# Patient Record
Sex: Female | Born: 1995 | Race: White | Hispanic: No | Marital: Single | Smoking: Never smoker
Health system: Southern US, Community
[De-identification: ages and names within clinical notes are randomized; demographics above are authoritative.]

---

## 2017-04-14 ENCOUNTER — Encounter (HOSPITAL_COMMUNITY): Payer: Self-pay | Admitting: Emergency Medicine

## 2017-04-14 ENCOUNTER — Other Ambulatory Visit: Payer: Self-pay

## 2017-04-14 ENCOUNTER — Inpatient Hospital Stay (HOSPITAL_COMMUNITY)
Admission: EM | Admit: 2017-04-14 | Discharge: 2017-04-16 | DRG: 343 | Disposition: A | Discharge status: Home / Self Care | Payer: BLUE CROSS/BLUE SHIELD | Attending: Surgery | Admitting: Surgery

## 2017-04-14 DIAGNOSIS — K37 Unspecified appendicitis: Secondary | ICD-10-CM | POA: Diagnosis present

## 2017-04-14 DIAGNOSIS — K3589 Other acute appendicitis without perforation or gangrene: Secondary | ICD-10-CM | POA: Diagnosis not present

## 2017-04-14 DIAGNOSIS — R000 Tachycardia, unspecified: Secondary | ICD-10-CM | POA: Diagnosis not present

## 2017-04-14 DIAGNOSIS — K3580 Unspecified acute appendicitis: Secondary | ICD-10-CM | POA: Diagnosis present

## 2017-04-14 LAB — URINALYSIS, ROUTINE W REFLEX MICROSCOPIC
BILIRUBIN URINE: NEGATIVE
GLUCOSE, UA: NEGATIVE mg/dL
HGB URINE DIPSTICK: NEGATIVE
KETONES UR: NEGATIVE mg/dL
Leukocytes, UA: NEGATIVE
Nitrite: NEGATIVE
PH: 5 (ref 5.0–8.0)
Protein, ur: NEGATIVE mg/dL
SPECIFIC GRAVITY, URINE: 1.009 (ref 1.005–1.030)

## 2017-04-14 LAB — COMPREHENSIVE METABOLIC PANEL
ALBUMIN: 3.8 g/dL (ref 3.5–5.0)
ALT: 20 U/L (ref 14–54)
ANION GAP: 7 (ref 5–15)
AST: 21 U/L (ref 15–41)
Alkaline Phosphatase: 51 U/L (ref 38–126)
BILIRUBIN TOTAL: 0.5 mg/dL (ref 0.3–1.2)
BUN: 9 mg/dL (ref 6–20)
CO2: 23 mmol/L (ref 22–32)
Calcium: 9.2 mg/dL (ref 8.9–10.3)
Chloride: 107 mmol/L (ref 101–111)
Creatinine, Ser: 0.85 mg/dL (ref 0.44–1.00)
GFR calc Af Amer: >60 mL/min (ref >60)
GFR calc non Af Amer: >60 mL/min (ref >60)
GLUCOSE: 106 mg/dL — AB (ref 65–99)
POTASSIUM: 3.8 mmol/L (ref 3.5–5.1)
SODIUM: 137 mmol/L (ref 135–145)
Total Protein: 7 g/dL (ref 6.5–8.1)

## 2017-04-14 LAB — I-STAT BETA HCG BLOOD, ED (MC, WL, AP ONLY): I-stat hCG, quantitative: <5 m[IU]/mL (ref <5)

## 2017-04-14 LAB — CBC
HEMATOCRIT: 42.7 % (ref 36.0–46.0)
HEMOGLOBIN: 14 g/dL (ref 12.0–15.0)
MCH: 28.1 pg (ref 26.0–34.0)
MCHC: 32.8 g/dL (ref 30.0–36.0)
MCV: 85.6 fL (ref 78.0–100.0)
Platelets: 308 10*3/uL (ref 150–400)
RBC: 4.99 MIL/uL (ref 3.87–5.11)
RDW: 13.3 % (ref 11.5–15.5)
WBC: 5.4 10*3/uL (ref 4.0–10.5)

## 2017-04-14 LAB — LIPASE, BLOOD: Lipase: 34 U/L (ref 11–51)

## 2017-04-14 MED ORDER — IOPAMIDOL (ISOVUE-300) INJECTION 61%
INTRAVENOUS | Status: AC
  Administered 2017-04-15: 100 mL
  Filled 2017-04-14: qty 100

## 2017-04-14 NOTE — ED Triage Notes (Signed)
Pt. Stated, I've had stomach pain on the right for 2 days had it earlier in the month and it went away and went away

## 2017-04-14 NOTE — ED Provider Notes (Signed)
[facility] Provider Note   CSN: [id number] Arrival date & time: 04/14/17  1220     History   Chief Complaint Chief Complaint  Patient presents with  . Abdominal Pain    right    HPI Katheren ShamsJenna S Radler is a 21 y.o. female.  The history is provided by the patient and medical records.  Abdominal Pain   Associated symptoms include nausea.    21 y.o. F with no significant medical history presenting to the ED with abdominal pain.  Patient reports she has had about 3 days of lower abdominal pain.  States it ranges from her navel to her right lower abdomen.  Pain is dull, constant, and aching.  States she has had a lot of nausea and has not been able to eat very much.  She has not had any vomiting or diarrhea.  No fever or chills.  No urinary symptoms.  No pelvic pain, changes in menstrual cycle, or vaginal discharge.  States she did have pain like this earlier in the month but not as severe, this went away on its own.  She has not noticed anything that alleviates or exacerbates her pain.  No prior abdominal surgeries.  History reviewed. No pertinent past medical history.  There are no active problems to display for this patient.   History reviewed. No pertinent surgical history.  OB History    No data available       Home Medications    Prior to Admission medications   Medication Sig Start Date End Date Taking? Authorizing Provider  NIKKI 3-0.02 MG tablet Take 1 tablet by mouth daily. 02/07/17  Yes [provider]    Family History No family history on file.  Social History Social History   Tobacco Use  . Smoking status: Never Smoker  . Smokeless tobacco: Never Used  Substance Use Topics  . Alcohol use: Yes  . Drug use: No     Allergies   Latex   Review of Systems Review of Systems  Gastrointestinal: Positive for abdominal pain and nausea.  All other systems reviewed and are negative.    Physical  Exam Updated Vital Signs BP 115/75   Pulse 93   Temp 98.4 F (36.9 C) (Oral)   Resp 18   Ht 5\' 10"  (1.778 m)   Wt 77.1 kg (170 lb)   LMP 03/31/2017   SpO2 99%   BMI 24.39 kg/m   Physical Exam  Constitutional: She is oriented to person, place, and time. She appears well-developed and well-nourished.  HENT:  Head: Normocephalic and atraumatic.  Mouth/Throat: Oropharynx is clear and moist.  Eyes: Conjunctivae and EOM are normal. Pupils are equal, round, and reactive to light.  Neck: Normal range of motion.  Cardiovascular: Normal rate, regular rhythm and normal heart sounds.  Pulmonary/Chest: Effort normal and breath sounds normal.  Abdominal: Soft. Bowel sounds are normal. There is tenderness in the right lower quadrant. There is no rigidity, no guarding and no CVA tenderness.    Musculoskeletal: Normal range of motion.  Neurological: She is alert and oriented to person, place, and time.  Skin: Skin is warm and dry.  Psychiatric: She has a normal mood and affect.  Nursing note and vitals reviewed.    ED Treatments / Results  Labs (all labs ordered are listed, but only abnormal results are displayed) Labs Reviewed  COMPREHENSIVE METABOLIC PANEL - Abnormal; Notable for the following components:      Result Value  Glucose, Bld 106 (*)    All other components within normal limits  LIPASE, BLOOD  CBC  URINALYSIS, ROUTINE W REFLEX MICROSCOPIC  I-STAT BETA HCG BLOOD, ED ([facility], [facility], [facility] ONLY)    EKG  EKG Interpretation None       Radiology Ct Abdomen Pelvis W Contrast  Result Date: 04/15/2017 CLINICAL DATA:  Acute onset of right-sided abdominal pain. EXAM: CT ABDOMEN AND PELVIS WITH CONTRAST TECHNIQUE: Multidetector CT imaging of the abdomen and pelvis was performed using the standard protocol following bolus administration of intravenous contrast. CONTRAST:  ISOVUE-300 IOPAMIDOL (ISOVUE-300) INJECTION 61% COMPARISON:  None. FINDINGS: Lower chest: The visualized  lung bases are grossly clear. The visualized portions of the mediastinum are unremarkable. Hepatobiliary: The liver is unremarkable in appearance. The gallbladder is unremarkable in appearance. The common bile duct remains normal in caliber. Pancreas: The pancreas is within normal limits. Spleen: The spleen is unremarkable in appearance. Adrenals/Urinary Tract: The adrenal glands are unremarkable in appearance. The kidneys are within normal limits. There is no evidence of hydronephrosis. No renal or ureteral stones are identified. No perinephric stranding is seen. Stomach/Bowel: The stomach is unremarkable in appearance. The small bowel is within normal limits. The appendix is dilated to 1.1 cm in maximal diameter, extending below the cecum, with minimal surrounding soft tissue inflammation, concerning for acute appendicitis. There is no evidence of perforation or abscess formation. No appendicolith is seen. Appendix: Location: Right lower quadrant. Diameter: 1.1 cm Appendicolith: No Mucosal hyper-enhancement: Yes Extraluminal gas: No Periappendiceal collection: No The colon is unremarkable in appearance. Vascular/Lymphatic: The abdominal aorta is unremarkable in appearance. The inferior vena cava is grossly unremarkable. No retroperitoneal lymphadenopathy is seen. No pelvic sidewall lymphadenopathy is identified. Reproductive: The bladder is mildly distended and grossly unremarkable. The uterus is unremarkable in appearance. The ovaries are relatively symmetric. No suspicious adnexal masses are seen. Other: No additional soft tissue abnormalities are seen. Musculoskeletal: No acute osseous abnormalities are identified. The visualized musculature is unremarkable in appearance. IMPRESSION: Acute appendicitis, with dilatation of the appendix to 1.1 cm in maximal diameter, and minimal surrounding soft tissue inflammation. No evidence of perforation or abscess formation at this time. No appendicolith seen. These results  were called by telephone at the time of interpretation on 04/15/2017 at 12:37 am to [facility], who verbally acknowledged these results. Electronically Signed   By: Roanna Raider M.D.   On: 04/15/2017 00:39    Procedures Procedures (including critical care time)  Medications Ordered in ED Medications  cefTRIAXone (ROCEPHIN) 2 g in dextrose 5 % 50 mL IVPB (not administered)  metroNIDAZOLE (FLAGYL) IVPB 500 mg (not administered)  iopamidol (ISOVUE-300) 61 % injection (100 mLs  Contrast Given 04/15/17 0001)     Initial Impression / Assessment and Plan / ED Course  I have reviewed the triage vital signs and the nursing notes.  Pertinent labs & imaging results that were available during my care of the patient were reviewed by me and considered in my medical decision making (see chart for details).  21 y.o. F here with 3 days of RLQ pain.  Reports similar pain earlier in the month but resolved on its own.  No fever or vomiting but has had nausea.  Afebrile, non-toxic here.  Abdomen with tenderness in RLQ.  Screening labs from triage overall reassuring.  CT scan will be obtained. Patient declines pain meds.  CT scan with acute appendicitis.  No abscess or perforation.  Pre-op abx ordered.  Discussed with on  call general surgery, Dr. Sheliah HatchKinsinger-- he will see and evaluate.  Patient and mother updated.  Patient still declines pain meds.  Final Clinical Impressions(s) / ED Diagnoses   Final diagnoses:  Other acute appendicitis    ED Discharge Orders    None       Garlon HatchetSanders, Shawnte Demarest M, PA-C 04/15/17 0113    Jacalyn LefevreHaviland, Julie, MD 04/17/17 1009

## 2017-04-15 ENCOUNTER — Observation Stay (HOSPITAL_COMMUNITY): Payer: BLUE CROSS/BLUE SHIELD | Admitting: Certified Registered"

## 2017-04-15 ENCOUNTER — Other Ambulatory Visit (HOSPITAL_COMMUNITY): Payer: Self-pay

## 2017-04-15 ENCOUNTER — Emergency Department (HOSPITAL_COMMUNITY): Payer: BLUE CROSS/BLUE SHIELD

## 2017-04-15 ENCOUNTER — Encounter (HOSPITAL_COMMUNITY)
Admission: EM | Disposition: A | Discharge status: Home / Self Care | Payer: Self-pay | Source: Home / Self Care | Attending: Surgery

## 2017-04-15 ENCOUNTER — Encounter (HOSPITAL_COMMUNITY): Payer: Self-pay | Admitting: Certified Registered"

## 2017-04-15 DIAGNOSIS — K3580 Unspecified acute appendicitis: Secondary | ICD-10-CM | POA: Diagnosis present

## 2017-04-15 DIAGNOSIS — R000 Tachycardia, unspecified: Secondary | ICD-10-CM | POA: Diagnosis not present

## 2017-04-15 DIAGNOSIS — K3589 Other acute appendicitis without perforation or gangrene: Secondary | ICD-10-CM | POA: Diagnosis present

## 2017-04-15 DIAGNOSIS — K37 Unspecified appendicitis: Secondary | ICD-10-CM | POA: Diagnosis present

## 2017-04-15 HISTORY — PX: LAPAROSCOPIC APPENDECTOMY: SHX408

## 2017-04-15 LAB — CBC
HCT: 41.1 % (ref 36.0–46.0)
Hemoglobin: 13.5 g/dL (ref 12.0–15.0)
MCH: 28.2 pg (ref 26.0–34.0)
MCHC: 32.8 g/dL (ref 30.0–36.0)
MCV: 85.8 fL (ref 78.0–100.0)
PLATELETS: 301 10*3/uL (ref 150–400)
RBC: 4.79 MIL/uL (ref 3.87–5.11)
RDW: 13.2 % (ref 11.5–15.5)
WBC: 12.2 10*3/uL — AB (ref 4.0–10.5)

## 2017-04-15 LAB — BASIC METABOLIC PANEL
ANION GAP: 12 (ref 5–15)
BUN: 8 mg/dL (ref 6–20)
CALCIUM: 9.5 mg/dL (ref 8.9–10.3)
CO2: 17 mmol/L — ABNORMAL LOW (ref 22–32)
Chloride: 106 mmol/L (ref 101–111)
Creatinine, Ser: 0.96 mg/dL (ref 0.44–1.00)
Glucose, Bld: 189 mg/dL — ABNORMAL HIGH (ref 65–99)
Potassium: 4.2 mmol/L (ref 3.5–5.1)
Sodium: 135 mmol/L (ref 135–145)

## 2017-04-15 LAB — CBC WITH DIFFERENTIAL/PLATELET
BASOS ABS: 0 10*3/uL (ref 0.0–0.1)
BASOS PCT: 0 %
EOS PCT: 0 %
Eosinophils Absolute: 0 10*3/uL (ref 0.0–0.7)
HEMATOCRIT: 40.3 % (ref 36.0–46.0)
Hemoglobin: 13.3 g/dL (ref 12.0–15.0)
Lymphocytes Relative: 7 %
Lymphs Abs: 0.6 10*3/uL — ABNORMAL LOW (ref 0.7–4.0)
MCH: 28.5 pg (ref 26.0–34.0)
MCHC: 33 g/dL (ref 30.0–36.0)
MCV: 86.3 fL (ref 78.0–100.0)
MONO ABS: 0.1 10*3/uL (ref 0.1–1.0)
Monocytes Relative: 1 %
NEUTROS ABS: 8.7 10*3/uL — AB (ref 1.7–7.7)
Neutrophils Relative %: 92 %
PLATELETS: 310 10*3/uL (ref 150–400)
RBC: 4.67 MIL/uL (ref 3.87–5.11)
RDW: 13.3 % (ref 11.5–15.5)
WBC: 9.4 10*3/uL (ref 4.0–10.5)

## 2017-04-15 LAB — MRSA PCR SCREENING: MRSA BY PCR: NEGATIVE

## 2017-04-15 SURGERY — APPENDECTOMY, LAPAROSCOPIC
Anesthesia: General | Site: Abdomen

## 2017-04-15 MED ORDER — ACETAMINOPHEN 325 MG PO TABS: 650.0000 mg | ORAL_TABLET | Freq: Four times a day (QID) | ORAL | Status: DC | PRN

## 2017-04-15 MED ORDER — FENTANYL CITRATE (PF) 250 MCG/5ML IJ SOLN
INTRAMUSCULAR | Status: AC
  Filled 2017-04-15: qty 5

## 2017-04-15 MED ORDER — DEXAMETHASONE SODIUM PHOSPHATE 10 MG/ML IJ SOLN
INTRAMUSCULAR | Status: AC
  Filled 2017-04-15: qty 1

## 2017-04-15 MED ORDER — MIDAZOLAM HCL 2 MG/2ML IJ SOLN
INTRAMUSCULAR | Status: AC
  Filled 2017-04-15: qty 2

## 2017-04-15 MED ORDER — METOPROLOL TARTRATE 5 MG/5ML IV SOLN: 5.0000 mg | Freq: Four times a day (QID) | INTRAVENOUS | Status: DC | PRN

## 2017-04-15 MED ORDER — FENTANYL CITRATE (PF) 100 MCG/2ML IJ SOLN
INTRAMUSCULAR | Status: AC
  Administered 2017-04-15: 25 ug via INTRAVENOUS
  Filled 2017-04-15: qty 2

## 2017-04-15 MED ORDER — OXYCODONE HCL 5 MG PO TABS: 5.0000 mg | ORAL_TABLET | Freq: Once | ORAL | Status: DC | PRN

## 2017-04-15 MED ORDER — ONDANSETRON HCL 4 MG/2ML IJ SOLN
INTRAMUSCULAR | Status: DC | PRN
  Administered 2017-04-15: 4 mg via INTRAVENOUS

## 2017-04-15 MED ORDER — 0.9 % SODIUM CHLORIDE (POUR BTL) OPTIME
TOPICAL | Status: DC | PRN
Start: 2017-04-15 — End: 2017-04-15
  Administered 2017-04-15: 1000 mL

## 2017-04-15 MED ORDER — ACETAMINOPHEN 650 MG RE SUPP: 650.0000 mg | Freq: Four times a day (QID) | RECTAL | Status: DC | PRN

## 2017-04-15 MED ORDER — ROCURONIUM BROMIDE 10 MG/ML (PF) SYRINGE
PREFILLED_SYRINGE | INTRAVENOUS | Status: DC | PRN
  Administered 2017-04-15: 40 mg via INTRAVENOUS

## 2017-04-15 MED ORDER — DEXTROSE 5 % IV SOLN
2.0000 g | Freq: Once | INTRAVENOUS | Status: AC
  Administered 2017-04-15: 2 g via INTRAVENOUS
  Filled 2017-04-15: qty 2

## 2017-04-15 MED ORDER — ROCURONIUM BROMIDE 10 MG/ML (PF) SYRINGE
PREFILLED_SYRINGE | INTRAVENOUS | Status: AC
  Filled 2017-04-15: qty 5

## 2017-04-15 MED ORDER — OXYCODONE HCL 5 MG PO TABS: 10.0000 mg | ORAL_TABLET | Freq: Four times a day (QID) | ORAL | Status: DC | PRN

## 2017-04-15 MED ORDER — PROPOFOL 10 MG/ML IV BOLUS
INTRAVENOUS | Status: AC
  Filled 2017-04-15: qty 20

## 2017-04-15 MED ORDER — ONDANSETRON HCL 4 MG/2ML IJ SOLN: 4.0000 mg | Freq: Four times a day (QID) | INTRAMUSCULAR | Status: DC | PRN

## 2017-04-15 MED ORDER — LIDOCAINE 2% (20 MG/ML) 5 ML SYRINGE
INTRAMUSCULAR | Status: AC
  Filled 2017-04-15: qty 5

## 2017-04-15 MED ORDER — NEOSTIGMINE METHYLSULFATE 5 MG/5ML IV SOSY
PREFILLED_SYRINGE | INTRAVENOUS | Status: DC | PRN
  Administered 2017-04-15: 3.5 mg via INTRAVENOUS

## 2017-04-15 MED ORDER — MIDAZOLAM HCL 2 MG/2ML IJ SOLN
INTRAMUSCULAR | Status: DC | PRN
  Administered 2017-04-15: 2 mg via INTRAVENOUS

## 2017-04-15 MED ORDER — LACTATED RINGERS IV SOLN
INTRAVENOUS | Status: DC
  Administered 2017-04-15: 08:00:00 via INTRAVENOUS

## 2017-04-15 MED ORDER — SIMETHICONE 80 MG PO CHEW: 80.0000 mg | CHEWABLE_TABLET | Freq: Four times a day (QID) | ORAL | Status: DC | PRN

## 2017-04-15 MED ORDER — HYDROCODONE-ACETAMINOPHEN 5-325 MG PO TABS
1.0000 | ORAL_TABLET | ORAL | Status: DC | PRN
  Administered 2017-04-15 – 2017-04-16 (×3): 2 via ORAL
  Filled 2017-04-15 (×3): qty 2

## 2017-04-15 MED ORDER — ONDANSETRON 4 MG PO TBDP: 4.0000 mg | ORAL_TABLET | Freq: Four times a day (QID) | ORAL | Status: DC | PRN

## 2017-04-15 MED ORDER — FENTANYL CITRATE (PF) 100 MCG/2ML IJ SOLN
INTRAMUSCULAR | Status: DC | PRN
  Administered 2017-04-15: 50 ug via INTRAVENOUS
  Administered 2017-04-15: 100 ug via INTRAVENOUS
  Administered 2017-04-15 (×2): 50 ug via INTRAVENOUS

## 2017-04-15 MED ORDER — BUPIVACAINE-EPINEPHRINE (PF) 0.25% -1:200000 IJ SOLN
INTRAMUSCULAR | Status: AC
  Filled 2017-04-15: qty 30

## 2017-04-15 MED ORDER — GLYCOPYRROLATE 0.2 MG/ML IV SOSY
PREFILLED_SYRINGE | INTRAVENOUS | Status: DC | PRN
  Administered 2017-04-15: 0.6 mg via INTRAVENOUS

## 2017-04-15 MED ORDER — KETOROLAC TROMETHAMINE 15 MG/ML IJ SOLN
15.0000 mg | Freq: Four times a day (QID) | INTRAMUSCULAR | Status: DC
  Administered 2017-04-15 – 2017-04-16 (×3): 15 mg via INTRAVENOUS
  Filled 2017-04-15 (×3): qty 1

## 2017-04-15 MED ORDER — DEXAMETHASONE SODIUM PHOSPHATE 10 MG/ML IJ SOLN
INTRAMUSCULAR | Status: DC | PRN
  Administered 2017-04-15: 10 mg via INTRAVENOUS

## 2017-04-15 MED ORDER — NEOSTIGMINE METHYLSULFATE 5 MG/5ML IV SOSY
PREFILLED_SYRINGE | INTRAVENOUS | Status: AC
  Filled 2017-04-15: qty 5

## 2017-04-15 MED ORDER — LIDOCAINE 2% (20 MG/ML) 5 ML SYRINGE
INTRAMUSCULAR | Status: DC | PRN
  Administered 2017-04-15: 60 mg via INTRAVENOUS

## 2017-04-15 MED ORDER — MORPHINE SULFATE (PF) 4 MG/ML IV SOLN: 2.0000 mg | INTRAVENOUS | Status: DC | PRN

## 2017-04-15 MED ORDER — SODIUM CHLORIDE 0.9 % IR SOLN
Status: DC | PRN
  Administered 2017-04-15: 1000 mL

## 2017-04-15 MED ORDER — HYDROMORPHONE HCL 1 MG/ML IJ SOLN: 0.5000 mg | INTRAMUSCULAR | Status: DC | PRN

## 2017-04-15 MED ORDER — METRONIDAZOLE IN NACL 5-0.79 MG/ML-% IV SOLN
500.0000 mg | Freq: Once | INTRAVENOUS | Status: AC
  Administered 2017-04-15: 500 mg via INTRAVENOUS
  Filled 2017-04-15: qty 100

## 2017-04-15 MED ORDER — PROPOFOL 10 MG/ML IV BOLUS
INTRAVENOUS | Status: DC | PRN
  Administered 2017-04-15: 20 mg via INTRAVENOUS
  Administered 2017-04-15: 100 mg via INTRAVENOUS

## 2017-04-15 MED ORDER — BUPIVACAINE-EPINEPHRINE 0.25% -1:200000 IJ SOLN
INTRAMUSCULAR | Status: DC | PRN
  Administered 2017-04-15: 10 mL

## 2017-04-15 MED ORDER — FENTANYL CITRATE (PF) 100 MCG/2ML IJ SOLN
25.0000 ug | INTRAMUSCULAR | Status: DC | PRN
  Administered 2017-04-15 (×3): 25 ug via INTRAVENOUS

## 2017-04-15 MED ORDER — OXYCODONE HCL 5 MG/5ML PO SOLN: 5.0000 mg | Freq: Once | ORAL | Status: DC | PRN

## 2017-04-15 MED ORDER — ONDANSETRON HCL 4 MG/2ML IJ SOLN
INTRAMUSCULAR | Status: AC
  Filled 2017-04-15: qty 2

## 2017-04-15 SURGICAL SUPPLY — 41 items
APPLIER CLIP 5 13 M/L LIGAMAX5 (MISCELLANEOUS)
BLADE CLIPPER SURG (BLADE) IMPLANT
CANISTER SUCT 3000ML PPV (MISCELLANEOUS) ×3 IMPLANT
CHLORAPREP W/TINT 26ML (MISCELLANEOUS) ×3 IMPLANT
CLIP APPLIE 5 13 M/L LIGAMAX5 (MISCELLANEOUS) IMPLANT
COVER SURGICAL LIGHT HANDLE (MISCELLANEOUS) ×3 IMPLANT
CUTTER ENDO LINEAR 45M (STAPLE) ×3 IMPLANT
CUTTER FLEX LINEAR 45M (STAPLE) ×6 IMPLANT
DERMABOND ADHESIVE PROPEN (GAUZE/BANDAGES/DRESSINGS) ×2
DERMABOND ADVANCED (GAUZE/BANDAGES/DRESSINGS) ×2
DERMABOND ADVANCED .7 DNX12 (GAUZE/BANDAGES/DRESSINGS) ×1 IMPLANT
DERMABOND ADVANCED .7 DNX6 (GAUZE/BANDAGES/DRESSINGS) ×1 IMPLANT
ELECT REM PT RETURN 9FT ADLT (ELECTROSURGICAL) ×3
ELECTRODE REM PT RTRN 9FT ADLT (ELECTROSURGICAL) ×1 IMPLANT
GLOVE BIOGEL M STRL SZ7.5 (GLOVE) IMPLANT
GLOVE BIOGEL PI IND STRL 8 (GLOVE) ×1 IMPLANT
GLOVE BIOGEL PI INDICATOR 8 (GLOVE) ×2
GOWN STRL REUS W/ TWL LRG LVL3 (GOWN DISPOSABLE) ×2 IMPLANT
GOWN STRL REUS W/ TWL XL LVL3 (GOWN DISPOSABLE) ×1 IMPLANT
GOWN STRL REUS W/TWL LRG LVL3 (GOWN DISPOSABLE) ×4
GOWN STRL REUS W/TWL XL LVL3 (GOWN DISPOSABLE) ×2
KIT BASIN OR (CUSTOM PROCEDURE TRAY) ×3 IMPLANT
KIT ROOM TURNOVER OR (KITS) ×3 IMPLANT
NS IRRIG 1000ML POUR BTL (IV SOLUTION) ×3 IMPLANT
PAD ARMBOARD 7.5X6 YLW CONV (MISCELLANEOUS) ×6 IMPLANT
POUCH SPECIMEN RETRIEVAL 10MM (ENDOMECHANICALS) ×3 IMPLANT
RELOAD 45 VASCULAR/THIN (ENDOMECHANICALS) IMPLANT
RELOAD STAPLE TA45 3.5 REG BLU (ENDOMECHANICALS) ×3 IMPLANT
SCISSORS LAP 5X35 DISP (ENDOMECHANICALS) IMPLANT
SET IRRIG TUBING LAPAROSCOPIC (IRRIGATION / IRRIGATOR) ×3 IMPLANT
SHEARS HARMONIC ACE PLUS 36CM (ENDOMECHANICALS) IMPLANT
SLEEVE ENDOPATH XCEL 5M (ENDOMECHANICALS) ×3 IMPLANT
SPECIMEN JAR SMALL (MISCELLANEOUS) ×3 IMPLANT
SUT MNCRL AB 4-0 PS2 18 (SUTURE) ×6 IMPLANT
TOWEL OR 17X24 6PK STRL BLUE (TOWEL DISPOSABLE) ×3 IMPLANT
TOWEL OR 17X26 10 PK STRL BLUE (TOWEL DISPOSABLE) ×3 IMPLANT
TRAY FOLEY CATH SILVER 16FR (SET/KITS/TRAYS/PACK) ×3 IMPLANT
TRAY LAPAROSCOPIC MC (CUSTOM PROCEDURE TRAY) ×3 IMPLANT
TROCAR XCEL BLUNT TIP 100MML (ENDOMECHANICALS) ×3 IMPLANT
TROCAR XCEL NON-BLD 5MMX100MML (ENDOMECHANICALS) ×3 IMPLANT
TUBING INSUFFLATION (TUBING) ×3 IMPLANT

## 2017-04-15 NOTE — Progress Notes (Signed)
Notified Dr Cliffton AstersWhite of patient's CBC result. No new order at this time.

## 2017-04-15 NOTE — Progress Notes (Signed)
21yo lady admitted overnight with acute appendicitis 3d of abdominal pain, RLQ most severe. +Nausea, no emesis.  AF VS normal TTP in RLQ; no tenderness elswhere; no r/g  WBC 5.4  CT A/P acute appendicitis - appendix dilated to 1.1cm in maximal diameter, minimal surrounding inflammation. No perforation or abscess  PLAN -OR today for laparoscopic vs open appendectomy -The anatomy & physiology of GI tract was discussed.  The pathophysiology of appendicitis was discussed.  Natural history risks without surgery was discussed.   I feel the risks of no intervention will lead to serious problems that outweigh the operative risks; therefore, I recommended appendectomy to remove the pathology.  I explained laparoscopic techniques with possible need for an open approach.   -Risks such as pain, bleeding, infection, abscess, leak, injury to other organs/viscus/bladder, need for further treatment, heart attack, stroke, death) benefits and alternatives were described.  I noted a good likelihood this will help address the problem.  Possibility that this will not correct all abdominal symptoms was explained.  Goals of post-operative recovery were discussed as well.  Her questions were answered to her satisfaction and she voiced understanding.  Stephanie Couphristopher M. Cliffton AstersWhite, M.D. [facility]

## 2017-04-15 NOTE — H&P (Signed)
Rebecca Clarke is an 21 y.o. female.   Chief Complaint: abdominal pain HPI: 21 yo female with 2 days of right lower abdominal pain. Pain is constant. It does not radiate. It was worse 2 days ago but still present today. It is associated with nausea but no vomiting. She has low grade fevers but never took a temperature. She denies diarrhea or constipation.  History reviewed. No pertinent past medical history.  History reviewed. No pertinent surgical history.  No family history on file. Social History:  reports that  has never smoked. she has never used smokeless tobacco. She reports that she drinks alcohol. She reports that she does not use drugs.  Allergies:  Allergies  Allergen Reactions  . Latex Rash     (Not in a hospital admission)  Results for orders placed or performed during the hospital encounter of 04/14/17 (from the past 48 hour(s))  Lipase, blood     Status: None   Collection Time: 04/14/17 12:31 PM  Result Value Ref Range   Lipase 34 11 - 51 U/L  Comprehensive metabolic panel     Status: Abnormal   Collection Time: 04/14/17 12:31 PM  Result Value Ref Range   Sodium 137 135 - 145 mmol/L   Potassium 3.8 3.5 - 5.1 mmol/L   Chloride 107 101 - 111 mmol/L   CO2 23 22 - 32 mmol/L   Glucose, Bld 106 (H) 65 - 99 mg/dL   BUN 9 6 - 20 mg/dL   Creatinine, Ser 0.85 0.44 - 1.00 mg/dL   Calcium 9.2 8.9 - 10.3 mg/dL   Total Protein 7.0 6.5 - 8.1 g/dL   Albumin 3.8 3.5 - 5.0 g/dL   AST 21 15 - 41 U/L   ALT 20 14 - 54 U/L   Alkaline Phosphatase 51 38 - 126 U/L   Total Bilirubin 0.5 0.3 - 1.2 mg/dL   GFR calc non Af Amer >60 >60 mL/min   GFR calc Af Amer >60 >60 mL/min    Comment: (NOTE) The eGFR has been calculated using the CKD EPI equation. This calculation has not been validated in all clinical situations. eGFR's persistently <60 mL/min signify possible Chronic Kidney Disease.    Anion gap 7 5 - 15  CBC     Status: None   Collection Time: 04/14/17 12:31 PM  Result  Value Ref Range   WBC 5.4 4.0 - 10.5 K/uL   RBC 4.99 3.87 - 5.11 MIL/uL   Hemoglobin 14.0 12.0 - 15.0 g/dL   HCT 42.7 36.0 - 46.0 %   MCV 85.6 78.0 - 100.0 fL   MCH 28.1 26.0 - 34.0 pg   MCHC 32.8 30.0 - 36.0 g/dL   RDW 13.3 11.5 - 15.5 %   Platelets 308 150 - 400 K/uL  Urinalysis, Routine w reflex microscopic     Status: None   Collection Time: 04/14/17  1:00 PM  Result Value Ref Range   Color, Urine YELLOW YELLOW   APPearance CLEAR CLEAR   Specific Gravity, Urine 1.009 1.005 - 1.030   pH 5.0 5.0 - 8.0   Glucose, UA NEGATIVE NEGATIVE mg/dL   Hgb urine dipstick NEGATIVE NEGATIVE   Bilirubin Urine NEGATIVE NEGATIVE   Ketones, ur NEGATIVE NEGATIVE mg/dL   Protein, ur NEGATIVE NEGATIVE mg/dL   Nitrite NEGATIVE NEGATIVE   Leukocytes, UA NEGATIVE NEGATIVE  I-Stat beta hCG blood, ED     Status: None   Collection Time: 04/14/17  1:20 PM  Result Value Ref Range  I-stat hCG, quantitative <5.0 <5 mIU/mL   Comment 3            Comment:   GEST. AGE      CONC.  (mIU/mL)   <=1 WEEK        5 - 50     2 WEEKS       50 - 500     3 WEEKS       100 - 10,000     4 WEEKS     1,000 - 30,000        FEMALE AND NON-PREGNANT FEMALE:     LESS THAN 5 mIU/mL    Ct Abdomen Pelvis W Contrast  Result Date: 04/15/2017 CLINICAL DATA:  Acute onset of right-sided abdominal pain. EXAM: CT ABDOMEN AND PELVIS WITH CONTRAST TECHNIQUE: Multidetector CT imaging of the abdomen and pelvis was performed using the standard protocol following bolus administration of intravenous contrast. CONTRAST:  162m ISOVUE-300 IOPAMIDOL (ISOVUE-300) INJECTION 61% COMPARISON:  None. FINDINGS: Lower chest: The visualized lung bases are grossly clear. The visualized portions of the mediastinum are unremarkable. Hepatobiliary: The liver is unremarkable in appearance. The gallbladder is unremarkable in appearance. The common bile duct remains normal in caliber. Pancreas: The pancreas is within normal limits. Spleen: The spleen is  unremarkable in appearance. Adrenals/Urinary Tract: The adrenal glands are unremarkable in appearance. The kidneys are within normal limits. There is no evidence of hydronephrosis. No renal or ureteral stones are identified. No perinephric stranding is seen. Stomach/Bowel: The stomach is unremarkable in appearance. The small bowel is within normal limits. The appendix is dilated to 1.1 cm in maximal diameter, extending below the cecum, with minimal surrounding soft tissue inflammation, concerning for acute appendicitis. There is no evidence of perforation or abscess formation. No appendicolith is seen. Appendix: Location: Right lower quadrant. Diameter: 1.1 cm Appendicolith: No Mucosal hyper-enhancement: Yes Extraluminal gas: No Periappendiceal collection: No The colon is unremarkable in appearance. Vascular/Lymphatic: The abdominal aorta is unremarkable in appearance. The inferior vena cava is grossly unremarkable. No retroperitoneal lymphadenopathy is seen. No pelvic sidewall lymphadenopathy is identified. Reproductive: The bladder is mildly distended and grossly unremarkable. The uterus is unremarkable in appearance. The ovaries are relatively symmetric. No suspicious adnexal masses are seen. Other: No additional soft tissue abnormalities are seen. Musculoskeletal: No acute osseous abnormalities are identified. The visualized musculature is unremarkable in appearance. IMPRESSION: Acute appendicitis, with dilatation of the appendix to 1.1 cm in maximal diameter, and minimal surrounding soft tissue inflammation. No evidence of perforation or abscess formation at this time. No appendicolith seen. These results were called by telephone at the time of interpretation on 04/15/2017 at 12:37 am to [facility], who verbally acknowledged these results. Electronically Signed   By: JGarald BaldingM.D.   On: 04/15/2017 00:39    Review of Systems  Constitutional: Positive for fever. Negative for chills.  HENT:  Negative for hearing loss.   Eyes: Negative for blurred vision and double vision.  Respiratory: Negative for cough and hemoptysis.   Cardiovascular: Negative for chest pain and palpitations.  Gastrointestinal: Positive for abdominal pain and nausea. Negative for vomiting.  Genitourinary: Negative for dysuria and urgency.  Musculoskeletal: Negative for myalgias and neck pain.  Skin: Negative for itching and rash.  Neurological: Negative for dizziness, tingling and headaches.  Endo/Heme/Allergies: Does not bruise/bleed easily.  Psychiatric/Behavioral: Negative for depression and suicidal ideas.    Blood pressure 112/73, pulse 87, temperature 98.4 F (36.9 C), temperature source Oral, resp. rate (Marland Kitchen  22, height 5' 10" (1.778 m), weight 77.1 kg (170 lb), last menstrual period 03/31/2017, SpO2 99 %. Physical Exam  Vitals reviewed. Constitutional: She is oriented to person, place, and time. She appears well-developed and well-nourished.  HENT:  Head: Normocephalic and atraumatic.  Eyes: Conjunctivae and EOM are normal. Pupils are equal, round, and reactive to light.  Neck: Normal range of motion. Neck supple.  Cardiovascular: Normal rate and regular rhythm.  Respiratory: Effort normal and breath sounds normal.  GI: Soft. Bowel sounds are normal. She exhibits no distension. There is tenderness in the right lower quadrant. There is no guarding.  Musculoskeletal: Normal range of motion.  Neurological: She is alert and oriented to person, place, and time.  Skin: Skin is warm and dry.  Psychiatric: She has a normal mood and affect. Her behavior is normal.     Assessment/Plan 21 yo female with normal labs and enlarged appendix and pain consistent with early acute appendicitis -lap appendectomy -observation -antibiotics -pain control  Mickeal Skinner, MD 04/15/2017, 2:06 AM

## 2017-04-15 NOTE — Anesthesia Postprocedure Evaluation (Signed)
Anesthesia Post Note  Patient: Katheren ShamsJenna S Zappulla  Procedure(s) Performed: APPENDECTOMY LAPAROSCOPIC (N/A Abdomen)     Patient location during evaluation: PACU Anesthesia Type: General Level of consciousness: awake and alert Pain management: pain level controlled Vital Signs Assessment: post-procedure vital signs reviewed and stable Respiratory status: spontaneous breathing, nonlabored ventilation, respiratory function stable and patient connected to nasal cannula oxygen Cardiovascular status: blood pressure returned to baseline and stable Postop Assessment: no apparent nausea or vomiting Anesthetic complications: no    Last Vitals:  Vitals:   04/15/17 1042 04/15/17 1046  BP:  132/84  Pulse: 92 (!) 102  Resp: 13 10  Temp:    SpO2: 97% 98%    Last Pain:  Vitals:   04/15/17 1042  TempSrc:   PainSc: 5                  Sari Cogan

## 2017-04-15 NOTE — Progress Notes (Signed)
[email or web address] Notified MD of increase pulse. Pt has minimal complaint of pain. Other VS with in normal limits.

## 2017-04-15 NOTE — Progress Notes (Signed)
Postop check - doing well - denies complaints aside from port site soreness. Tolerating diet without n/v. Ambulating. Pain controlled.  AF; HR 112 on my check. BP normal. Sats 97% on RA. No CP/SOB. Clinically looks good.  Objective: Vital signs in last 24 hours: Temp:  [97.5 F (36.4 C)-98.6 F (37 C)] 98.3 F (36.8 C) (12/29 1811) Pulse Rate:  [79-127] 127 (12/29 1811) Resp:  [10-22] 16 (12/29 1439) BP: (102-136)/(60-84) 116/60 (12/29 1811) SpO2:  [95 %-100 %] 97 % (12/29 1811) Weight:  [78.7 kg (173 lb 8 oz)] 78.7 kg (173 lb 8 oz) (12/29 0639) Last BM Date: 04/13/17  Intake/Output from previous day: 12/28 0701 - 12/29 0700 In: 150 [IV Piggyback:150] Out: -  Intake/Output this shift: No intake/output data recorded.  Gen: NAD, comfortable CV: Tachycardic rate, regular rhythm Pulm: Normal work of breathing Abd: Soft, NT/ND; incisions c/d/i Ext: SCDs in place  Lab Results: CBC  Recent Labs    04/15/17 1223 04/15/17 1853  WBC 12.2* 9.4  HGB 13.5 13.3  HCT 41.1 40.3  PLT 301 310   BMET Recent Labs    04/14/17 1231  NA 137  K 3.8  CL 107  CO2 23  GLUCOSE 106*  BUN 9  CREATININE 0.85  CALCIUM 9.2    Anti-infectives: Anti-infectives (From admission, onward)   Start     Dose/Rate Route Frequency Ordered Stop   04/15/17 0915  metroNIDAZOLE (FLAGYL) IVPB 500 mg     500 mg 100 mL/hr over 60 Minutes Intravenous  Once 04/15/17 0911 04/15/17 0925   04/15/17 0100  cefTRIAXone (ROCEPHIN) 2 g in dextrose 5 % 50 mL IVPB     2 g 100 mL/hr over 30 Minutes Intravenous  Once 04/15/17 0047 04/15/17 0155   04/15/17 0100  metroNIDAZOLE (FLAGYL) IVPB 500 mg     500 mg 100 mL/hr over 60 Minutes Intravenous  Once 04/15/17 0047 04/15/17 0228       Assessment/Plan: Patient Active Problem List   Diagnosis Date Noted  . Acute appendicitis 04/15/2017  . Appendicitis 04/15/2017   s/p Procedure(s): APPENDECTOMY LAPAROSCOPIC 04/15/2017  -Afebrile, some tachycardia but  normotensive and sats are normal on room air -Abdominal exam is benign -Good UOP -Hgb stable -Will add scheduled Toradol for pain control and place on telemetry monitor   LOS: 0 days   Stephanie Couphristopher M. Cliffton AstersWhite, M.D. General and Colorectal Surgery [facility]

## 2017-04-15 NOTE — Discharge Instructions (Signed)
[facility] ______[facility] °LAPAROSCOPIC SURGERY: POST OP INSTRUCTIONS °Always review your discharge instruction sheet given to you by the facility where your surgery was performed. °IF YOU HAVE DISABILITY OR FAMILY LEAVE FORMS, YOU MUST BRING THEM TO THE OFFICE FOR PROCESSING.   °DO NOT GIVE THEM TO YOUR DOCTOR. ° °1. A prescription for pain medication may be given to you upon discharge.  Take your pain medication as prescribed, if needed.  If narcotic pain medicine is not needed, then you may take acetaminophen (Tylenol) or ibuprofen (Advil) as needed. °2. Take your usually prescribed medications unless otherwise directed. °3. If you need a refill on your pain medication, please contact your pharmacy.  They will contact our office to request authorization. Prescriptions will not be filled after 5pm or on week-ends. °4. You should follow a light diet the first few days after arrival home, such as soup and crackers, etc.  Be sure to include lots of fluids daily. °5. Most patients will experience some swelling and bruising in the area of the incisions.  Ice packs will help.  Swelling and bruising can take several days to resolve.  °6. It is common to experience some constipation if taking pain medication after surgery.  Increasing fluid intake and taking a stool softener (such as Colace) will usually help or prevent this problem from occurring.  A mild laxative (Milk of Magnesia or Miralax) should be taken according to package instructions if there are no bowel movements after 48 hours. °7. Unless discharge instructions indicate otherwise, you may remove your bandages 24-48 hours after surgery, and you may shower at that time.  You may have steri-strips (small skin tapes) in place directly over the incision.  These strips should be left on the skin for 7-10 days.  If your surgeon used skin glue on the incision, you may shower in 24 hours.  The glue will flake off over the next 2-3 weeks.  Any sutures or  staples will be removed at the office during your follow-up visit. °8. ACTIVITIES:  You may resume regular (light) daily activities beginning the next day--such as daily self-care, walking, climbing stairs--gradually increasing activities as tolerated.  You may have sexual intercourse when it is comfortable.  Refrain from any heavy lifting or straining until approved by your doctor. °a. You may drive when you are no longer taking prescription pain medication, you can comfortably wear a seatbelt, and you can safely maneuver your car and apply brakes. °b. RETURN TO WORK:  __________________________________________________________ °9. You should see your doctor in the office for a follow-up appointment approximately 2-3 weeks after your surgery.  Make sure that you call for this appointment within a day or two after you arrive home to insure a convenient appointment time. °10. OTHER INSTRUCTIONS: __________________________________________________________________________________________________________________________ __________________________________________________________________________________________________________________________ °WHEN TO CALL YOUR DOCTOR: °1. Fever over 101.0 °2. Inability to urinate °3. Continued bleeding from incision. °4. Increased pain, redness, or drainage from the incision. °5. Increasing abdominal pain ° °The clinic staff is available to answer your questions during regular business hours.  Please don’t hesitate to call and ask to speak to one of the nurses for clinical concerns.  If you have a medical emergency, go to the nearest emergency room or call 911.  A surgeon from [facility] is always on call at the hospital. °[address], [address], [place], [place]  [address] ? [address] [address], [place], [place]   [address] °[phone] ? [phone] ? FAX [phone] °Web site:   [email or web address] ° ° °Laparoscopic Appendectomy, Adult, Care After °Refer to  this sheet in the next few weeks. These instructions provide you with information about caring for yourself after your procedure. Your health care provider may also give you more specific instructions. Your treatment has been planned according to current medical practices, but problems sometimes occur. Call your health care provider if you have any problems or questions after your procedure. °What can I expect after the procedure? °After the procedure, it is common to have: °· A decrease in your energy level. °· Mild pain in the area where the surgical cuts (incisions) were made. °· Constipation. This can be caused by pain medicine and a decrease in your activity. ° °Follow these instructions at home: °Medicines °· Take over-the-counter and prescription medicines only as told by your health care provider. °· Do not drive for 24 hours if you received a sedative. °· Do not drive or operate heavy machinery while taking prescription pain medicine. °· If you were prescribed an antibiotic medicine, take it as told by your health care provider. Do not stop taking the antibiotic even if you start to feel better. °Activity °· For 3 weeks or as long as told by your health care provider: °? Do not lift anything that is heavier than 10 pounds (4.5 kg). °? Do not play contact sports. °· Gradually return to your normal activities. Ask your health care provider what activities are safe for you. °Bathing °· Keep your incisions clean and dry. Clean them as often as told by your health care provider: °? Gently wash the incisions with soap and water. °? Rinse the incisions with water to remove all soap. °? Pat the incisions dry with a clean towel. Do not rub the incisions. °· You may take showers after 48 hours. °· Do not take baths, swim, or use hot tubs for 2 weeks or as told by your health care provider. °Incision care °· Follow instructions from your healthcare provider about how to take care of your incisions. Make sure  you: °? Wash your hands with soap and water before you change your bandage (dressing). If soap and water are not available, use hand sanitizer. °? Change your dressing as told by your health care provider. °? Leave stitches (sutures), skin glue, or adhesive strips in place. These skin closures may need to stay in place for 2 weeks or longer. If adhesive strip edges start to loosen and curl up, you may trim the loose edges. Do not remove adhesive strips completely unless your health care provider tells you to do that. °· Check your incision areas every day for signs of infection. Check for: °? More redness, swelling, or pain. °? More fluid or blood. °? Warmth. °? Pus or a bad smell. °Other Instructions °· If you were sent home with a drain, follow instructions from your health care provider about how to care for the drain and how to empty it. °· Take deep breaths. This helps to prevent your lungs from becoming inflamed. °· To relieve and prevent constipation: °? Drink plenty of fluids. °? Eat plenty of fruits and vegetables. °· Keep all follow-up visits as told by your health care provider. This is important. °Contact a health care provider if: °· You have more redness, swelling, or pain around an incision. °· You have more fluid or blood coming from an incision. °· Your incision feels warm to the touch. °· You have pus or a bad smell coming from an incision or   dressing. °· Your incision edges break open after your sutures have been removed. °· You have increasing pain in your shoulders. °· You feel dizzy or you faint. °· You develop shortness of breath. °· You keep feeling nauseous or vomiting. °· You have diarrhea or you cannot control your bowel functions. °· You lose your appetite. °· You develop swelling or pain in your legs. °Get help right away if: °· You have a fever. °· You develop a rash. °· You have difficulty breathing. °· You have sharp pains in your chest. °This information is not intended to replace  advice given to you by your health care provider. Make sure you discuss any questions you have with your health care provider. °Document Released: 04/04/2005 Document Revised: 09/04/2015 Document Reviewed: 09/22/2014 °Elsevier Interactive Patient Education © 2018 [employer] ° °

## 2017-04-15 NOTE — Anesthesia Preprocedure Evaluation (Signed)
Anesthesia Evaluation  Patient identified by MRN, date of birth, ID band Patient awake    Reviewed: Allergy & Precautions, NPO status , Patient's Chart, lab work & pertinent test results  History of Anesthesia Complications Negative for: history of anesthetic complications  Airway Mallampati: IV  TM Distance: <3 FB Neck ROM: Full    Dental  (+) Teeth Intact   Pulmonary neg pulmonary ROS,    breath sounds clear to auscultation       Cardiovascular negative cardio ROS   Rhythm:Regular     Neuro/Psych negative neurological ROS  negative psych ROS   GI/Hepatic Neg liver ROS, appendicitis   Endo/Other  negative endocrine ROS  Renal/GU negative Renal ROS     Musculoskeletal   Abdominal   Peds  Hematology negative hematology ROS (+)   Anesthesia Other Findings   Reproductive/Obstetrics                             Anesthesia Physical Anesthesia Plan  ASA: I  Anesthesia Plan: General   Post-op Pain Management:    Induction: Intravenous  PONV Risk Score and Plan: 3 and Ondansetron, Dexamethasone and Midazolam  Airway Management Planned: Oral ETT  Additional Equipment: None  Intra-op Plan:   Post-operative Plan: Extubation in OR  Informed Consent: I have reviewed the patients History and Physical, chart, labs and discussed the procedure including the risks, benefits and alternatives for the proposed anesthesia with the patient or authorized representative who has indicated his/her understanding and acceptance.   Dental advisory given  Plan Discussed with: CRNA and Surgeon  Anesthesia Plan Comments: (Neostigmine for reversal)        Anesthesia Quick Evaluation

## 2017-04-15 NOTE — Op Note (Signed)
Katheren ShamsJenna S Preuss [id number]   PRE-OPERATIVE DIAGNOSIS:  appendicitis  POST-OPERATIVE DIAGNOSIS:  appendicitis  Procedure(s): APPENDECTOMY LAPAROSCOPIC  SURGEON:  Stephanie CoupChristopher M. Hawraa Stambaugh, M.D.  ASSISTANT: None  ANESTHESIA: General endotracheal  EBL:   10cc  DRAINS: None  SPECIMEN:  Appendix  COUNTS:  Sponge, needle and instrument counts were reported correct x2 at conclusion of the operation  DISPOSITION:  PACU in satisfactory condition  FINDINGS: Inflamed appendix, nonperforated.  INDICATIONS: 21yo lady admitted overnight with acute appendicitis 3d of abdominal pain, RLQ most severe. +Nausea, no emesis.  AF VS normal TTP in RLQ; no tenderness elswhere; no r/g  WBC 5.4  CT A/P acute appendicitis - appendix dilated to 1.1cm in maximal diameter, minimal surrounding inflammation. No perforation or abscess  The anatomy & physiology of the digestive tract was discussed. The pathophysiology of appendicitis was discussed. Natural history and a non-operative approach was additionally discussed. I feel the risks of no intervention may lead to serious problems that outweigh the operative risks; therefore, I recommended appendectomy to remove the probable pathology to the patient. I explained laparoscopic techniques with possible need for an open approach.  The procedure, material risks (including but not limited to pain, bleeding; infection; scarring; abscess; leak; need for ostomy; hernia; injury to other organs such as bowel, blood vessels, nerves; the need for additional procedures; heart attack; stroke; death), benefits and alternatives to surgery were discussed at length. The patient's questions were answered to their satisfaction and the patient elected to proceed with surgery.  I noted a good likelihood this will help address the problem.  Possibility that this will not correct all abdominal symptoms was also explained.  Goals of post-operative recovery were discussed as  well.  DESCRIPTION:   The patient was identified & brought into the operating room. SCDs were in place and functioning. General endotracheal anesthesia was administered. Preoperative antibiotics were administered. The patient was positioned supine with left arm tucked. A foley catheter was inserted under sterile conditions. The abdomen was prepped and draped in the standard sterile fashion. A surgical timeout confirmed our plan.  A small incision was made in the infraumbilical fold. The subcutaneous tissue was dissected and the umbilical stalk identified. The stalk was grasped with a Kocher and retracted outwardly. The infraumbilical fascia was exposed and incised. Peritoneal entry was carefully made bluntly. An 0 Vicryl purse-string suture was placed and then the [facility] port was introduced into the abdomen.  CO2 insufflation commenced to 15mmHg. The laparoscope was inserted and confirmed no evidence of injury. The patient was then positioned in Trendelenburg. Two additional ports were placed - one in left lower quadrant and another in the suprapubic midline. The patient was then placed in the left side down position.  The terminal ileum was gently mobilized to proximal ascending colon in a lateral to medial fashion. Care was taken to avoid injuring any retroperitoneal structures. The attachments to the appendix from the ascending colon and cecal mesentery were freed.  The appendix was elevated.  The base of the appendix was circumferentially dissected. The base was noted to be viable and healthy appearing.  The appendix was then stapled with a blue load, taking a small healthy cuff of viable cecum. The mesoappendix was then ligated using a Karlissa Aron load of the stapler. The appendix was placed in an EndoBag and extracted through the umbilical port site.  The right lower quadrant was irrigated. Hemostasis was noted to be achieved - taking time to inspect the ligated mesoappendix, colon mesentery, and  retroperitoneum. Staple line was noted to be intact on the cecum with no bleeding. There was no perforation or injury. The right lower quadrant appeared clean and as such, no drain was placed.  The left lower quadrant and suprapubic ports were removed under direct visualization. The CO2 was exhausted from the abdomen. The umbilical fascia was then closed using 0 Vicryl suture. The skin of all port sites was then approximated using 4-0 Monocryl suture. The incisions were dressed with Dermabond.  The patient was then extubated and transferred to a stretcher for transport to recover in satisfactory condition.

## 2017-04-15 NOTE — ED Notes (Signed)
Attempted report to 6N. Left name and number with secretary who sts will give info to nurse.

## 2017-04-15 NOTE — Transfer of Care (Signed)
Immediate Anesthesia Transfer of Care Note  Patient: Rebecca Clarke  Procedure(s) Performed: APPENDECTOMY LAPAROSCOPIC (N/A Abdomen)  Patient Location: PACU  Anesthesia Type:General  Level of Consciousness: awake, alert  and oriented  Airway & Oxygen Therapy: Patient Spontanous Breathing  Post-op Assessment: Report given to RN  Post vital signs: Reviewed and stable  Last Vitals:  Vitals:   04/15/17 0504 04/15/17 0639  BP: 102/65 104/67  Pulse: 100 80  Resp: 14 16  Temp: 36.7 C 36.6 C  SpO2: 99% 98%    Last Pain:  Vitals:   04/15/17 0639  TempSrc: Oral  PainSc:          Complications: No apparent anesthesia complications

## 2017-04-15 NOTE — Anesthesia Procedure Notes (Signed)
Procedure Name: Intubation Date/Time: 04/15/2017 8:38 AM Performed by: Barrington Ellison, CRNA Pre-anesthesia Checklist: Patient identified, Emergency Drugs available, Suction available and Patient being monitored Patient Re-evaluated:Patient Re-evaluated prior to induction Oxygen Delivery Method: Circle System Utilized Preoxygenation: Pre-oxygenation with 100% oxygen Induction Type: IV induction Ventilation: Mask ventilation without difficulty Laryngoscope Size: Mac and 4 Grade View: Grade I Tube type: Oral Tube size: 7.0 mm Number of attempts: 1 Airway Equipment and Method: Stylet and Oral airway Placement Confirmation: ETT inserted through vocal cords under direct vision,  positive ETCO2 and breath sounds checked- equal and bilateral Secured at: 21 cm Tube secured with: Tape Dental Injury: Teeth and Oropharynx as per pre-operative assessment

## 2017-04-15 NOTE — Progress Notes (Signed)
Rebecca Clarke is a 21 y.o. female patient admitted from ED awake, alert - oriented  X 4 - no acute distress noted.  VSS - Blood pressure 104/67, pulse 100, temperature 97.9 F (36.6 C), temperature source Oral, resp. rate 16, height 5\' 10"  (1.778 m), weight 78.7 kg (173 lb 8 oz), last menstrual period 03/31/2017, SpO2 98 %.    IV in place, occlusive dsg intact without redness.       Will cont to eval and treat per MD orders.  Rolland PorterJosephine M Herminio Kniskern, RN 04/15/2017 6:55 AM

## 2017-04-16 ENCOUNTER — Encounter (HOSPITAL_COMMUNITY): Payer: Self-pay | Admitting: Surgery

## 2017-04-16 LAB — HIV ANTIBODY (ROUTINE TESTING W REFLEX): HIV Screen 4th Generation wRfx: NONREACTIVE

## 2017-04-16 MED ORDER — ACETAMINOPHEN 325 MG PO TABS: ORAL_TABLET | ORAL | Status: AC

## 2017-04-16 MED ORDER — IBUPROFEN 600 MG PO TABS: 600.0000 mg | ORAL_TABLET | Freq: Four times a day (QID) | ORAL | Status: DC | PRN

## 2017-04-16 MED ORDER — HYDROCODONE-ACETAMINOPHEN 5-325 MG PO TABS: 1.0000 | ORAL_TABLET | ORAL | 0 refills | Status: AC | PRN

## 2017-04-16 MED ORDER — IBUPROFEN 200 MG PO TABS: ORAL_TABLET | ORAL | Status: AC

## 2017-04-16 NOTE — Progress Notes (Signed)
1 Day Post-Op    CC: Abdominal pain  Subjective: Rebecca Clarke is sore but overall looks good and is doing well.  We will plan to discharge later this a.m.  Pain seems to be well-controlled with Norco.  Objective: Vital signs in last 24 hours: Temp:  [97.5 F (36.4 C)-98.6 F (37 C)] 98.3 F (36.8 C) (12/30 0510) Pulse Rate:  [79-127] 105 (12/30 0510) Resp:  [10-18] 16 (12/30 0510) BP: (101-136)/(53-84) 101/53 (12/30 0510) SpO2:  [95 %-100 %] 97 % (12/30 0510) Last BM Date: 04/13/17 1086 p.o. 825 IV 652 urine Afebrile slightly tachycardic.  Blood pressure is stable. Labs this a.m. shows her glucose is 189 CBC is normal, WBC 9.4. Intake/Output from previous day: 12/29 0701 - 12/30 0700 In: 1938.5 [P.O.:1086; I.V.:852.5] Out: 657 [Urine:652; Blood:5] Intake/Output this shift: No intake/output data recorded.  General appearance: alert, cooperative and no distress Resp: clear to auscultation bilaterally GI: Soft, sore, port sites look good.  Lab Results:  Recent Labs    04/15/17 1223 04/15/17 1853  WBC 12.2* 9.4  HGB 13.5 13.3  HCT 41.1 40.3  PLT 301 310    BMET Recent Labs    04/14/17 1231 04/15/17 1853  NA 137 135  K 3.8 4.2  CL 107 106  CO2 23 17*  GLUCOSE 106* 189*  BUN 9 8  CREATININE 0.85 0.96  CALCIUM 9.2 9.5   PT/INR No results for input(s): LABPROT, INR in the last 72 hours.  Recent Labs  Lab 04/14/17 1231  AST 21  ALT 20  ALKPHOS 51  BILITOT 0.5  PROT 7.0  ALBUMIN 3.8     Lipase     Component Value Date/Time   LIPASE 34 04/14/2017 1231     Prior to Admission medications   Medication Sig Start Date End Date Taking? Authorizing Provider  NIKKI 3-0.02 MG tablet Take 1 tablet by mouth daily. 02/07/17  Yes [provider]    Medications: . ketorolac  15 mg Intravenous Q6H   . lactated ringers 10 mL/hr at 04/15/17 0749   Anti-infectives (From admission, onward)   Start     Dose/Rate Route Frequency Ordered Stop   04/15/17  0915  metroNIDAZOLE (FLAGYL) IVPB 500 mg     500 mg 100 mL/hr over 60 Minutes Intravenous  Once 04/15/17 0911 04/15/17 0925   04/15/17 0100  cefTRIAXone (ROCEPHIN) 2 g in dextrose 5 % 50 mL IVPB     2 g 100 mL/hr over 30 Minutes Intravenous  Once 04/15/17 0047 04/15/17 0155   04/15/17 0100  metroNIDAZOLE (FLAGYL) IVPB 500 mg     500 mg 100 mL/hr over 60 Minutes Intravenous  Once 04/15/17 0047 04/15/17 0228      Assessment/Plan Acute appendicitis Laparoscopic appendectomy, 04/15/17, Dr. Marin Olphristopher White  FEN: Regular diet ID: Preop Flagyl and Rocephin DVT: SCDs Foley: None Follow-up: [facility]   Plan: Home today follow-up in the clinic.   LOS: 1 day    Rebecca Clarke 04/16/2017 [phone]

## 2017-04-16 NOTE — Progress Notes (Signed)
1021 Patient discharged to home. Verbalizes understanding of all discharge instructions including incision care, discharge medications, and follow up MD visits. Patient transported home by mother.

## 2017-04-16 NOTE — Progress Notes (Signed)
Physician Discharge Summary  Patient ID: Rebecca Clarke MRN: [id number] DOB/AGE: [birth year] 21 y.o.  Admit date: 04/14/2017 Discharge date: 04/16/2017  Admission Diagnoses:  Acute appendicitis  Discharge Diagnoses:  Acute appendicitis  Active Problems:   Acute appendicitis   Appendicitis   PROCEDURES: Laparoscopic appendectomy, 04/15/17 Dr. Mills Kollerhristopher White  Hospital Course:   21 yo female with 2 days of right lower abdominal pain. Pain is constant. It does not radiate. It was worse 2 days ago but still present today. It is associated with nausea but no vomiting. She has low grade fevers but never took a temperature. She denies diarrhea or constipation.  Patient was seen in the emergency department and admitted by Dr. Sheliah HatchKinsinger around 2 AM. She was taken to the operating room later that a.m. by Dr. Cliffton AstersWhite.  She underwent laparoscopic appendectomy is done well postop.  First postoperative morning she is eating breakfast with, pains been tolerated with p.o. pain medications.  We will ambulate her and plan to discharge her later this a.m.  [facility].  Condition on discharge: Improved.   CBC Latest Ref Rng & Units 04/15/2017 04/15/2017 04/14/2017  WBC 4.0 - 10.5 K/uL 9.4 12.2(H) 5.4  Hemoglobin 12.0 - 15.0 g/dL 95.213.3 84.113.5 32.414.0  Hematocrit 36.0 - 46.0 % 40.3 41.1 42.7  Platelets 150 - 400 K/uL 310 301 308   CMP Latest Ref Rng & Units 04/15/2017 04/14/2017  Glucose 65 - 99 mg/dL 401(U189(H) 272(Z106(H)  BUN 6 - 20 mg/dL 8 9  Creatinine 3.660.44 - 1.00 mg/dL 4.400.96 3.470.85  Sodium 425135 - 145 mmol/L 135 137  Potassium 3.5 - 5.1 mmol/L 4.2 3.8  Chloride 101 - 111 mmol/L 106 107  CO2 22 - 32 mmol/L 17(L) 23  Calcium 8.9 - 10.3 mg/dL 9.5 9.2  Total Protein 6.5 - 8.1 g/dL - 7.0  Total Bilirubin 0.3 - 1.2 mg/dL - 0.5  Alkaline Phos 38 - 126 U/L - 51  AST 15 - 41 U/L - 21  ALT 14 - 54 U/L - 20     Disposition: Final discharge disposition not  confirmed   Allergies as of 04/16/2017      Reactions   Latex Rash      Medication List    TAKE these medications   acetaminophen 325 MG tablet Commonly known as:  TYLENOL He can take 2 tablets every 4 hours as needed for pain.  You can alternate this with ibuprofen or the Norco.  DO NOT TAKE MORE THAN 4000 MG OF TYLENOL PER DAY.  IT CAN HARM YOUR LIVER.  TYLENOL (ACETAMINOPHEN) IS ALSO IN YOUR PRESCRIPTION PAIN MEDICATION.  YOU HAVE TO COUNT IT IN YOUR DAILY TOTAL.   HYDROcodone-acetaminophen 5-325 MG tablet Commonly known as:  NORCO/VICODIN Take 1-2 tablets by mouth every 4 (four) hours as needed for moderate pain.   ibuprofen 200 MG tablet Commonly known as:  ADVIL,MOTRIN You can take 2-3 tablets every 6 hours as needed for pain.  You can alternate this with the plain Tylenol or the Norco.  You can buy this over-the-counter at any drugstore.   NIKKI 3-0.02 MG tablet Generic drug:  drospirenone-ethinyl estradiol Take 1 tablet by mouth daily.      Follow-up Information    [facility]   Specialty:  General Surgery Why:  Our office should call with an appointment in 2-3 weeks.  If you do not hear from them by Thursday next week call and  ask for an appointment in the [facility] clinic. Contact information: [address] [place] [address] [phone]           Signed: Sherrie GeorgeJENNINGS,Rebecca Clarke 04/16/2017, 8:23 AM

## 2017-04-19 NOTE — Discharge Summary (Signed)
Physician Discharge Summary  Patient ID: Admit date: 04/14/2017 Discharge date: 04/16/2017  Admission Diagnoses:  Acute appendicitis  Discharge Diagnoses:  Acute appendicitis  Active Problems:   Acute appendicitis   Appendicitis   PROCEDURES: Laparoscopic appendectomy, 04/15/17 Dr. Mills Koller Course:  22 yo female with 2 days of right lower abdominal pain. Pain is constant. It does not radiate. It was worse 2 days ago but still present today. It is associated with nausea but no vomiting. She has low grade fevers but never took a temperature. She denies diarrhea or constipation.  Patient was seen in the emergency department and admitted by Dr. Sheliah Hatch around 2 AM. She was taken to the operating room later that a.m. by Dr. Cliffton Asters.  She underwent laparoscopic appendectomy is done well postop.  First postoperative morning she is eating breakfast with, pains been tolerated with p.o. pain medications.  We will ambulate her and plan to discharge her later this a.m.  Follow-up will be in the [facility] clinic in 2-3 weeks.  Condition on discharge: Improved.   CBC Latest Ref Rng & Units 04/15/2017 04/15/2017 04/14/2017  WBC 4.0 - 10.5 K/uL 9.4 12.2(H) 5.4  Hemoglobin 12.0 - 15.0 g/dL 16.1 09.6 04.5  Hematocrit 36.0 - 46.0 % 40.3 41.1 42.7  Platelets 150 - 400 K/uL 310 301 308   CMP Latest Ref Rng & Units 04/15/2017 04/14/2017  Glucose 65 - 99 mg/dL 409(W) 119(J)  BUN 6 - 20 mg/dL 8 9  Creatinine 4.78 - 1.00 mg/dL 2.95 6.21  Sodium 308 - 145 mmol/L 135 137  Potassium 3.5 - 5.1 mmol/L 4.2 3.8  Chloride 101 - 111 mmol/L 106 107  CO2 22 - 32 mmol/L 17(L) 23  Calcium 8.9 - 10.3 mg/dL 9.5 9.2  Total Protein 6.5 - 8.1 g/dL - 7.0  Total Bilirubin 0.3 - 1.2 mg/dL - 0.5  Alkaline Phos 38 - 126 U/L - 51  AST 15 - 41 U/L - 21  ALT 14 - 54 U/L - 20     Disposition: Final discharge disposition not confirmed      Allergies as of 04/16/2017      Reactions    Latex Rash              Medication List     TAKE these medications   acetaminophen 325 MG tablet Commonly known as:  TYLENOL He can take 2 tablets every 4 hours as needed for pain.  You can alternate this with ibuprofen or the Norco.  DO NOT TAKE MORE THAN 4000 MG OF TYLENOL PER DAY.  IT CAN HARM YOUR LIVER.  TYLENOL (ACETAMINOPHEN) IS ALSO IN YOUR PRESCRIPTION PAIN MEDICATION.  YOU HAVE TO COUNT IT IN YOUR DAILY TOTAL.   HYDROcodone-acetaminophen 5-325 MG tablet Commonly known as:  NORCO/VICODIN Take 1-2 tablets by mouth every 4 (four) hours as needed for moderate pain.   ibuprofen 200 MG tablet Commonly known as:  ADVIL,MOTRIN You can take 2-3 tablets every 6 hours as needed for pain.  You can alternate this with the plain Tylenol or the Norco.  You can buy this over-the-counter at any drugstore.   NIKKI 3-0.02 MG tablet Generic drug:  drospirenone-ethinyl estradiol Take 1 tablet by mouth daily.         Follow-up Information    [facility] Follow up.   Specialty:  General Surgery Why:  Our office should call with an appointment in 2-3 weeks.  If you do not hear from them by Thursday  next week call and ask for an appointment in the [facility] clinic. Contact information: [address] [place] [id number] [phone]           Signed: Sherrie GeorgeJENNINGS,Kioni Stahl 04/16/2017, 8:23 AM

## 2018-11-17 IMAGING — CT CT ABD-PELV W/ CM
2 of 4 series · 16 of 46 positions shown, 18 images · IV contrast (Omni 300)
Comparison: None.

CLINICAL DATA: Acute onset of right-sided abdominal pain.

EXAM:
CT ABDOMEN AND PELVIS WITH CONTRAST
TECHNIQUE: Multidetector CT imaging of the abdomen and pelvis was performed
using the standard protocol following bolus administration of
intravenous contrast.
CONTRAST:  100mL [id number] IOPAMIDOL ([id number]) INJECTION 61%

[Series 3: a/p w/ 5mm · axial · 0.92mm/px · z∈[+705,+1145]mm · 13 of 98 slices shown, 15 images]
[im 5/98  soft-tissue]
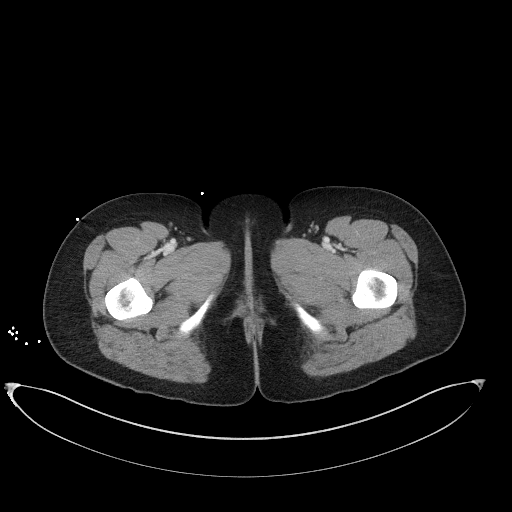
[im 5/98  bone]
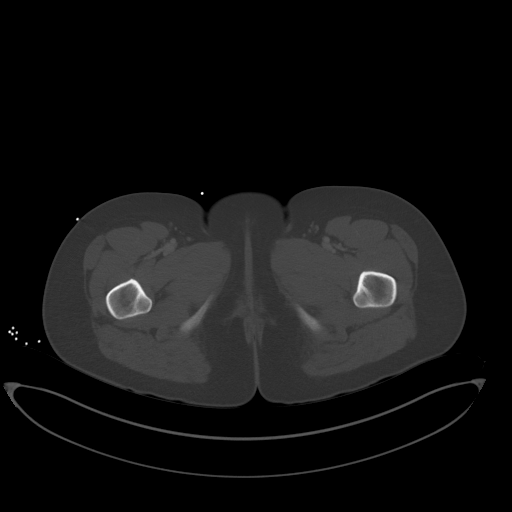
[im 13/98  soft-tissue]
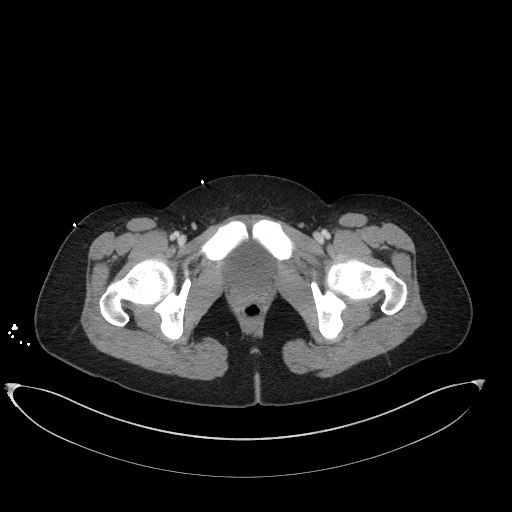
[im 21/98  soft-tissue]
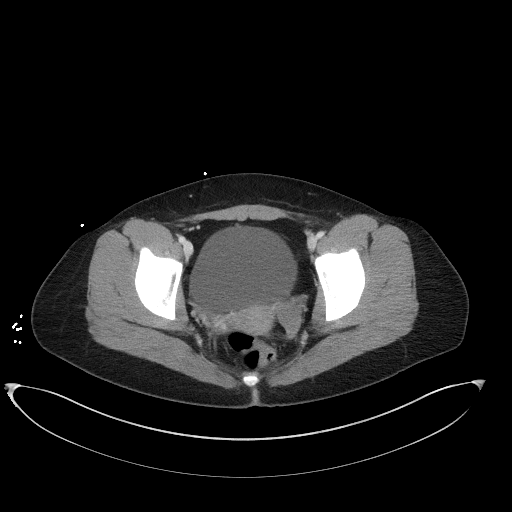
[im 29/98  soft-tissue]
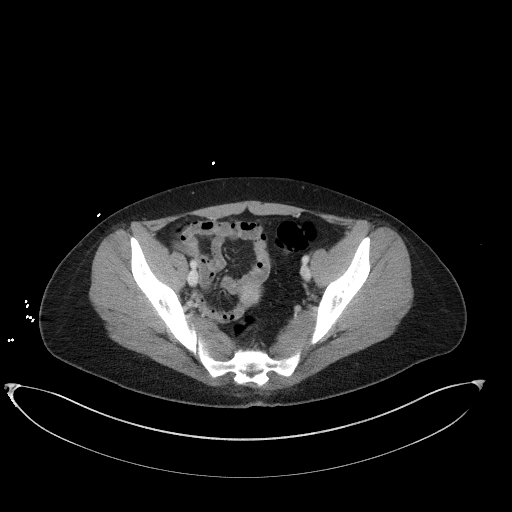
[im 33/98  soft-tissue]
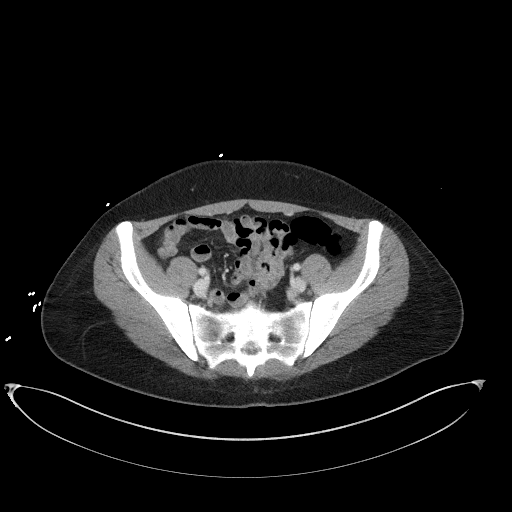
[im 41/98  soft-tissue]
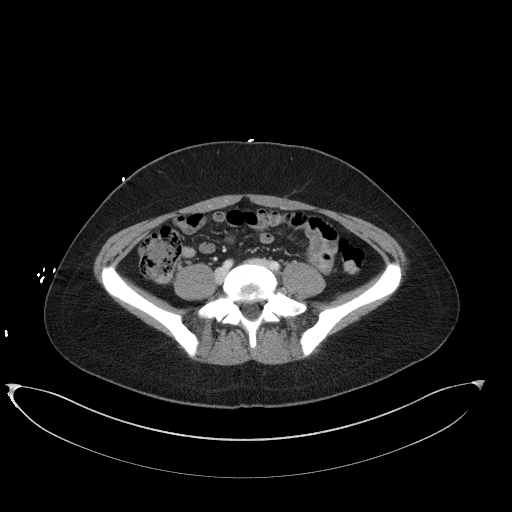
[im 49/98  soft-tissue]
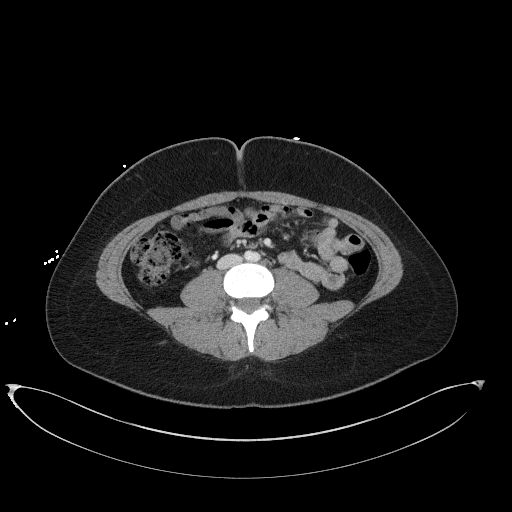
[im 57/98  soft-tissue]
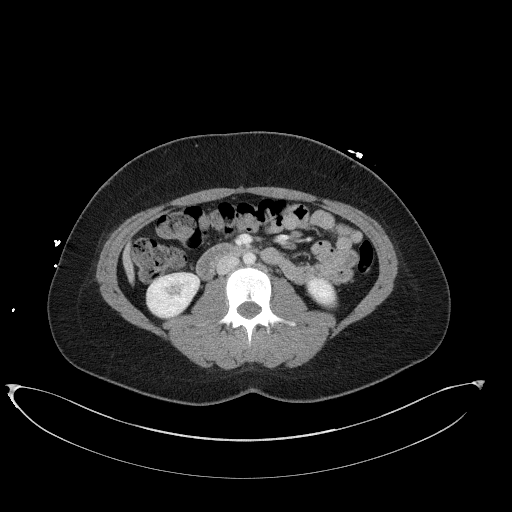
[im 65/98  soft-tissue]
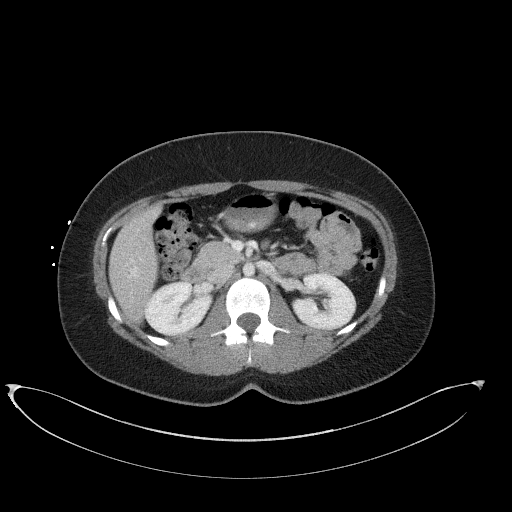
[im 65/98  bone]
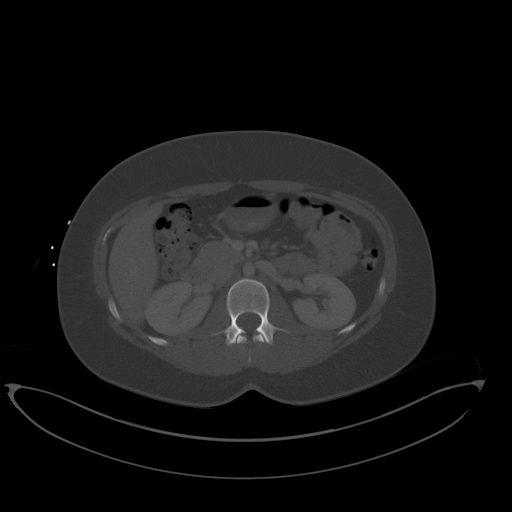
[im 69/98  soft-tissue]
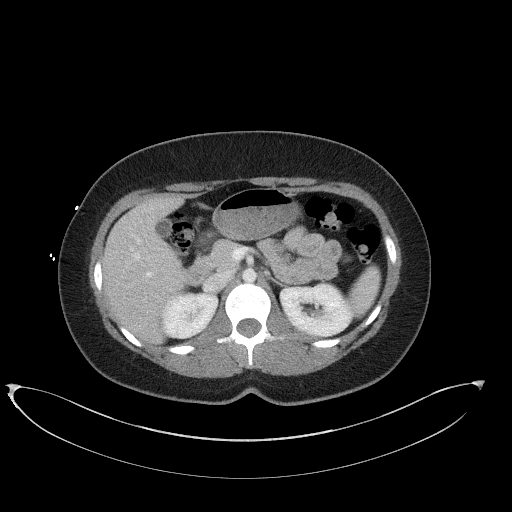
[im 77/98  soft-tissue]
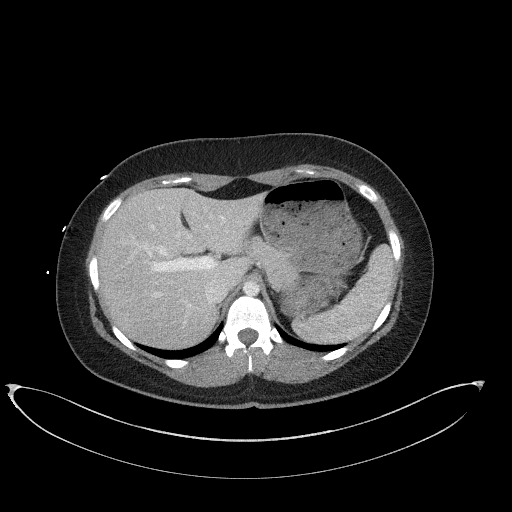
[im 85/98  soft-tissue]
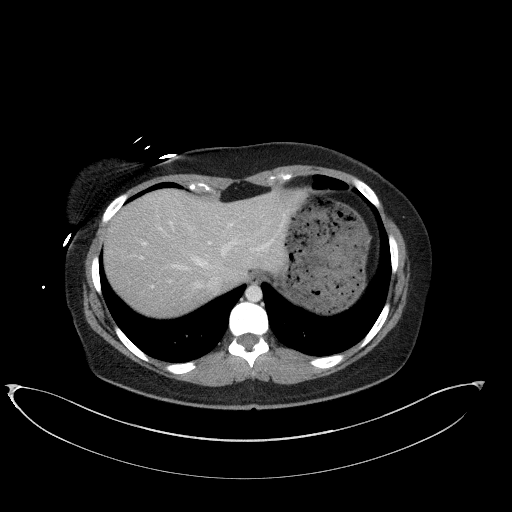
[im 93/98  soft-tissue]
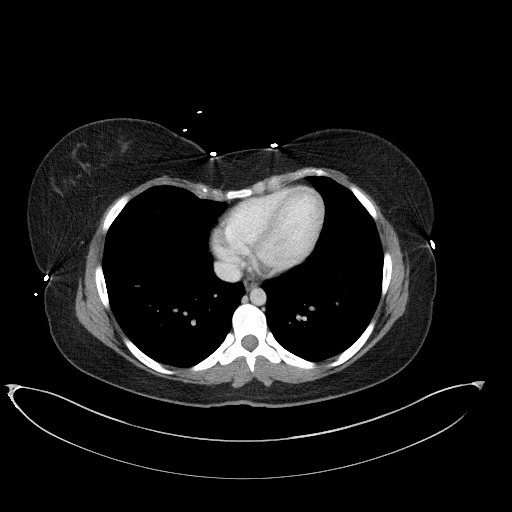

[Series 6: a/p w/ cor · coronal · 0.95mm/px · 3 of 150 slices shown]
[im 50/150  soft-tissue]
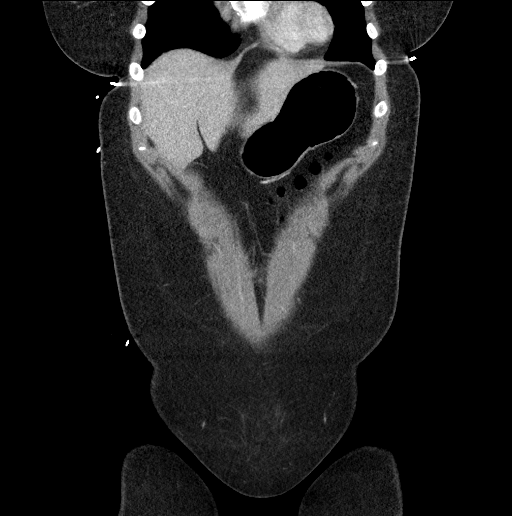
[im 67/150  soft-tissue]
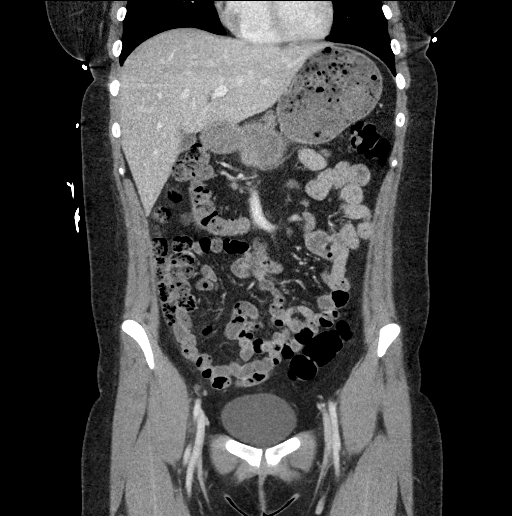
[im 83/150  soft-tissue]
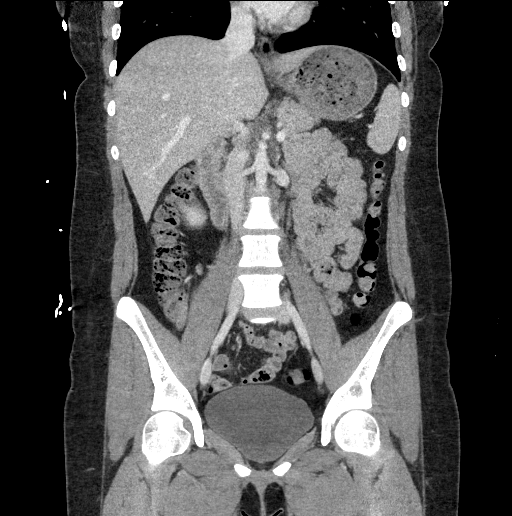

[16 of 46 positions shown; findings below may reference images not displayed]

FINDINGS: Lower chest: The visualized lung bases are grossly clear. The
visualized portions of the mediastinum are unremarkable.

Hepatobiliary: The liver is unremarkable in appearance. The
gallbladder is unremarkable in appearance. The common bile duct
remains normal in caliber.

Pancreas: The pancreas is within normal limits.

Spleen: The spleen is unremarkable in appearance.

Adrenals/Urinary Tract: The adrenal glands are unremarkable in
appearance. The kidneys are within normal limits. There is no
evidence of hydronephrosis. No renal or ureteral stones are
identified. No perinephric stranding is seen.

Stomach/Bowel: The stomach is unremarkable in appearance. The small
bowel is within normal limits.

The appendix is dilated to 1.1 cm in maximal diameter, extending
below the cecum, with minimal surrounding soft tissue inflammation,
concerning for acute appendicitis. There is no evidence of
perforation or abscess formation. No appendicolith is seen.

Appendix: Location: Right lower quadrant.

Diameter: 1.1 cm

Appendicolith: No

Mucosal hyper-enhancement: Yes

Extraluminal gas: No

Periappendiceal collection: No

The colon is unremarkable in appearance.

Vascular/Lymphatic: The abdominal aorta is unremarkable in
appearance. The inferior vena cava is grossly unremarkable. No
retroperitoneal lymphadenopathy is seen. No pelvic sidewall
lymphadenopathy is identified.

Reproductive: The bladder is mildly distended and grossly
unremarkable. The uterus is unremarkable in appearance. The ovaries
are relatively symmetric. No suspicious adnexal masses are seen.

Other: No additional soft tissue abnormalities are seen.

Musculoskeletal: No acute osseous abnormalities are identified. The
visualized musculature is unremarkable in appearance.
IMPRESSION: Acute appendicitis, with dilatation of the appendix to 1.1 cm in
maximal diameter, and minimal surrounding soft tissue inflammation.
No evidence of perforation or abscess formation at this time. No
appendicolith seen.

These results were called by telephone at the time of interpretation
acknowledged these results.

## 2023-08-09 ENCOUNTER — Emergency Department (HOSPITAL_BASED_OUTPATIENT_CLINIC_OR_DEPARTMENT_OTHER)

## 2023-08-09 ENCOUNTER — Other Ambulatory Visit: Payer: Self-pay

## 2023-08-09 ENCOUNTER — Encounter (HOSPITAL_BASED_OUTPATIENT_CLINIC_OR_DEPARTMENT_OTHER): Payer: Self-pay | Admitting: Emergency Medicine

## 2023-08-09 DIAGNOSIS — S6992XA Unspecified injury of left wrist, hand and finger(s), initial encounter: Secondary | ICD-10-CM | POA: Diagnosis present

## 2023-08-09 DIAGNOSIS — Z91040 Latex allergy status: Secondary | ICD-10-CM | POA: Diagnosis not present

## 2023-08-09 DIAGNOSIS — W010XXA Fall on same level from slipping, tripping and stumbling without subsequent striking against object, initial encounter: Secondary | ICD-10-CM | POA: Diagnosis not present

## 2023-08-09 DIAGNOSIS — S6392XA Sprain of unspecified part of left wrist and hand, initial encounter: Secondary | ICD-10-CM | POA: Insufficient documentation

## 2023-08-09 NOTE — ED Triage Notes (Signed)
 Pt c/o pain to LT little finger and forearm after she fell tonight (tripped on fell on grass); had ibuprofen  600mg  at 2000

## 2023-08-10 ENCOUNTER — Emergency Department (HOSPITAL_BASED_OUTPATIENT_CLINIC_OR_DEPARTMENT_OTHER)
Admission: EM | Admit: 2023-08-10 | Discharge: 2023-08-10 | Disposition: A | Discharge status: Home / Self Care | Attending: Emergency Medicine | Admitting: Emergency Medicine

## 2023-08-10 DIAGNOSIS — S6392XA Sprain of unspecified part of left wrist and hand, initial encounter: Secondary | ICD-10-CM

## 2023-08-10 NOTE — Discharge Instructions (Addendum)
 You may take Tylenol  or ibuprofen  according to label instructions as needed for pain.  Keep your splint clean and dry.  Please follow-up with orthopedics if you continue to have pain.

## 2023-08-10 NOTE — ED Provider Notes (Signed)
 [facility] Provider Note   CSN: [id number] Arrival date & time: 08/09/23  2131     History  Chief Complaint  Patient presents with   Arm Injury    Rebecca Clarke is a 28 y.o. female.  The history is provided by the patient.  Arm Injury Rebecca Clarke is a 28 y.o. female who presents to the Emergency Department complaining of fall.  She presents to the emergency department for evaluation of injuries following a slip and fell yesterday where she hyperextended her fifth digit of her left hand.  She complains of pain to the lateral aspect of the left hand and pain with extension of the 4th and 5th digits.  Pain goes to about the mid arm.  She is right-hand dominant and has no significant medical problems.  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      Home Medications Prior to Admission medications   Medication Sig Start Date End Date Taking? Authorizing Provider  acetaminophen  (TYLENOL ) 325 MG tablet He can take 2 tablets every 4 hours as needed for pain.  You can alternate this with ibuprofen  or the Norco.  DO NOT TAKE MORE THAN 4000 MG OF TYLENOL  PER DAY.  IT CAN HARM YOUR LIVER.  TYLENOL  (ACETAMINOPHEN ) IS ALSO IN YOUR PRESCRIPTION PAIN MEDICATION.  YOU HAVE TO COUNT IT IN YOUR DAILY TOTAL. 04/16/17   Monetta Angst, PA-C  HYDROcodone -acetaminophen  (NORCO/VICODIN) 5-325 MG tablet Take 1-2 tablets by mouth every 4 (four) hours as needed for moderate pain. 04/16/17   Monetta Angst, PA-C  ibuprofen  (ADVIL ,MOTRIN ) 200 MG tablet You can take 2-3 tablets every 6 hours as needed for pain.  You can alternate this with the plain Tylenol  or the Norco.  You can buy this over-the-counter at any drugstore. 04/16/17   Monetta Angst, PA-C  NIKKI 3-0.02 MG tablet Take 1 tablet by mouth daily. 02/07/17   [provider]      Allergies    Latex    Review of Systems   Review of Systems  All other systems reviewed and are  negative.   Physical Exam Updated Vital Signs BP 133/81 (BP Location: Right Arm)   Pulse 84   Temp (!) 97.5 F (36.4 C)   Resp 18   Ht 5\' 9"  (1.753 m)   Wt 95.3 kg   SpO2 100%   BMI 31.01 kg/m  Physical Exam Vitals and nursing note reviewed.  Constitutional:      Appearance: She is well-developed.  HENT:     Head: Normocephalic and atraumatic.  Cardiovascular:     Rate and Rhythm: Normal rate and regular rhythm.  Pulmonary:     Effort: Pulmonary effort is normal. No respiratory distress.  Musculoskeletal:     Comments: Mild soft tissue swelling and tenderness over the fifth metacarpal.  She has unable to fully extend the 4th and 5th digits with active or passive range of motion.  Compartments are soft.  She is able to flex and extend at the wrist without significant pain or tenderness.  2+ radial pulse.  Sensation to light touch intact throughout the digits.  Skin:    General: Skin is warm and dry.  Neurological:     Mental Status: She is alert and oriented to person, place, and time.  Psychiatric:        Behavior: Behavior normal.     ED Results / Procedures / Treatments   Labs (all labs ordered  are listed, but only abnormal results are displayed) Labs Reviewed - No data to display  EKG None  Radiology DG Forearm Left Result Date: 08/09/2023 CLINICAL DATA:  Pain after fall. EXAM: LEFT FOREARM - 2 VIEW COMPARISON:  None Available. FINDINGS: There is no evidence of fracture or other focal bone lesions. The cortical margins of the radius and ulna are intact. Wrist and elbow alignment are maintained. Mild soft tissue edema. IMPRESSION: Mild soft tissue edema. No fracture of the left forearm. Electronically Signed   By: Chadwick Colonel M.D.   On: 08/09/2023 22:51   DG Finger Little Left Result Date: 08/09/2023 CLINICAL DATA:  Fall, unable to straighten finger.  Pain. EXAM: LEFT FINGER(S) - 2+ VIEW COMPARISON:  None Available. FINDINGS: The digit is held in flexion at the  proximal interphalangeal joint on all views. No fracture. No frank dislocation. There is generalized soft tissue edema. IMPRESSION: Flexion of the digit at the proximal interphalangeal joint. No fracture or dislocation. Electronically Signed   By: Chadwick Colonel M.D.   On: 08/09/2023 22:48    Procedures Procedures    Medications Ordered in ED Medications - No data to display  ED Course/ Medical Decision Making/ A&P                                 Medical Decision Making Amount and/or Complexity of Data Reviewed Radiology: ordered.   Patient here for evaluation of left hand injury following a slip and fall.  She cannot fully extend the 4th or 5th digit of the left hand.  There is no obvious fracture.  Compartment is soft.  Evaluation is not consistent with compartment syndrome.  Given inability to range the digits will place in splint with orthopedics follow-up and return precautions.        Final Clinical Impression(s) / ED Diagnoses Final diagnoses:  Sprain of left hand, initial encounter    Rx / DC Orders ED Discharge Orders     None         Kelsey Patricia, MD 08/10/23 [phone]
